# Patient Record
Sex: Male | Born: 2000 | Race: Black or African American | Hispanic: No | Marital: Single | Smoking: Never smoker
Health system: Southern US, Community
[De-identification: ages and names within clinical notes are randomized; demographics above are authoritative.]

## PROBLEM LIST (undated history)

## (undated) DIAGNOSIS — L0291 Cutaneous abscess, unspecified: Secondary | ICD-10-CM

## (undated) DIAGNOSIS — J45909 Unspecified asthma, uncomplicated: Secondary | ICD-10-CM

---
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
## 2019-12-06 ENCOUNTER — Emergency Department (HOSPITAL_BASED_OUTPATIENT_CLINIC_OR_DEPARTMENT_OTHER): Payer: BC Managed Care – PPO

## 2019-12-06 ENCOUNTER — Emergency Department (HOSPITAL_BASED_OUTPATIENT_CLINIC_OR_DEPARTMENT_OTHER)
Admission: EM | Admit: 2019-12-06 | Discharge: 2019-12-06 | Disposition: A | Discharge status: Home / Self Care | Payer: BC Managed Care – PPO | Attending: Emergency Medicine | Admitting: Emergency Medicine

## 2019-12-06 ENCOUNTER — Encounter (HOSPITAL_BASED_OUTPATIENT_CLINIC_OR_DEPARTMENT_OTHER): Payer: Self-pay | Admitting: Emergency Medicine

## 2019-12-06 ENCOUNTER — Other Ambulatory Visit: Payer: Self-pay

## 2019-12-06 DIAGNOSIS — Y92310 Basketball court as the place of occurrence of the external cause: Secondary | ICD-10-CM | POA: Insufficient documentation

## 2019-12-06 DIAGNOSIS — W2209XA Striking against other stationary object, initial encounter: Secondary | ICD-10-CM | POA: Diagnosis not present

## 2019-12-06 DIAGNOSIS — S93105A Unspecified dislocation of left toe(s), initial encounter: Secondary | ICD-10-CM

## 2019-12-06 DIAGNOSIS — Y999 Unspecified external cause status: Secondary | ICD-10-CM | POA: Insufficient documentation

## 2019-12-06 DIAGNOSIS — Y9367 Activity, basketball: Secondary | ICD-10-CM | POA: Insufficient documentation

## 2019-12-06 DIAGNOSIS — S93112A Dislocation of interphalangeal joint of left great toe, initial encounter: Secondary | ICD-10-CM | POA: Insufficient documentation

## 2019-12-06 DIAGNOSIS — S99922A Unspecified injury of left foot, initial encounter: Secondary | ICD-10-CM | POA: Diagnosis present

## 2019-12-06 MED ORDER — IBUPROFEN 800 MG PO TABS: 800.0000 mg | ORAL_TABLET | Freq: Three times a day (TID) | ORAL | 0 refills | Status: DC | PRN

## 2019-12-06 MED ORDER — BUPIVACAINE HCL 0.5 % IJ SOLN
20.0000 mL | Freq: Once | INTRAMUSCULAR | Status: AC
  Administered 2019-12-06: 20 mL
  Filled 2019-12-06: qty 1

## 2019-12-06 NOTE — ED Triage Notes (Signed)
Pt reports playing basketball today, when left big toe hit into goal post. Pt seen at urgent care, sent here for further eval.

## 2019-12-06 NOTE — Discharge Instructions (Signed)
Return here as needed.  Ice and elevate your toe.  Follow-up as needed with the orthopedist provided.

## 2019-12-06 NOTE — ED Provider Notes (Signed)
[facility] Provider Note   CSN: [id number] Arrival date & time: 12/06/19  1556     History Chief Complaint  Patient presents with  . Toe Injury    Max Clark is a 19 y.o. male.  HPI Patient presents to the emergency department with toe injury that was seen at an urgent care.  The patient states that he hit his toe against a pole while playing basketball today.  The patient states that he was sent here for further treatment of the dislocation of his great toe.  Patient has no other injuries.    Past Medical History:  Diagnosis Date  . Asthma     There are no problems to display for this patient.   History reviewed. No pertinent surgical history.     No family history on file.  Social History   Tobacco Use  . Smoking status: Never Smoker  . Smokeless tobacco: Never Used  Substance Use Topics  . Alcohol use: Not Currently  . Drug use: Not Currently    Home Medications Prior to Admission medications   Not on File    Allergies    Patient has no known allergies.  Review of Systems   Review of Systems All other systems negative except as documented in the HPI. All pertinent positives and negatives as reviewed in the HPI. Physical Exam Updated Vital Signs BP 133/81   Pulse 88   Temp 99.1 F (37.3 C) (Oral)   Resp 18   Ht 6\' 2"  (1.88 m)   Wt 77.1 kg   SpO2 100%   BMI 21.83 kg/m   Physical Exam Vitals and nursing note reviewed.  Constitutional:      General: He is not in acute distress.    Appearance: He is well-developed.  HENT:     Head: Normocephalic and atraumatic.  Eyes:     Pupils: Pupils are equal, round, and reactive to light.  Pulmonary:     Effort: Pulmonary effort is normal.  Musculoskeletal:     Left foot: Decreased range of motion. Normal capillary refill. Deformity, tenderness and bony tenderness present.       Feet:  Skin:    General: Skin is warm and dry.  Neurological:     Mental Status: He  is alert and oriented to person, place, and time.     ED Results / Procedures / Treatments   Labs (all labs ordered are listed, but only abnormal results are displayed) Labs Reviewed - No data to display  EKG None  Radiology DG Toe Great Left  Result Date: 12/06/2019 CLINICAL DATA:  Left great toe pain after basketball injury, pt slammed his toe into metal post, dislocated, reduction attempted at UC without success, sent to ED for eval EXAM: LEFT GREAT TOE COMPARISON:  None. FINDINGS: There is antrolateral dislocation of the distal phalanx of the great toe. No definite fracture identified. No focal bone lesion or significant degenerative changes. Regional soft tissues are unremarkable. IMPRESSION: Anterolateral dislocation of the distal phalanx of the great toe. Electronically Signed   By: Audie Pinto M.D.   On: 12/06/2019 16:48    Procedures Reduction of dislocation  Date/Time: 12/06/2019 6:01 PM Performed by: Dalia Heading, PA-C Authorized by: Dalia Heading, PA-C  Consent: Verbal consent obtained. Written consent not obtained. Risks and benefits: risks, benefits and alternatives were discussed Consent given by: patient Patient understanding: patient states understanding of the procedure being performed Patient consent: the patient's understanding of the procedure  matches consent given Imaging studies: imaging studies available Patient identity confirmed: verbally with patient Local anesthesia used: yes Anesthesia: digital block  Anesthesia: Local anesthesia used: yes Local Anesthetic: bupivacaine 0.5% without epinephrine Anesthetic total: 6 mL  Sedation: Patient sedated: no  Patient tolerance: patient tolerated the procedure well with no immediate complications Comments: Successful reduction of the dislocation    (including critical care time)  Medications Ordered in ED Medications  bupivacaine (MARCAINE) 0.5 % (with pres) injection 20 mL (20 mLs  Infiltration Given by Other 12/06/19 1709)    ED Course  I have reviewed the triage vital signs and the nursing notes.  Pertinent labs & imaging results that were available during my care of the patient were reviewed by me and considered in my medical decision making (see chart for details).    MDM Rules/Calculators/A&P                       Final Clinical Impression(s) / ED Diagnoses Final diagnoses:  None    Rx / DC Orders ED Discharge Orders    None       Charlestine Night, PA-C 12/06/19 1802    Pollyann Savoy, MD 12/06/19 [phone]

## 2020-06-09 ENCOUNTER — Encounter (HOSPITAL_BASED_OUTPATIENT_CLINIC_OR_DEPARTMENT_OTHER): Payer: Self-pay | Admitting: Emergency Medicine

## 2020-06-09 ENCOUNTER — Emergency Department (HOSPITAL_BASED_OUTPATIENT_CLINIC_OR_DEPARTMENT_OTHER)
Admission: EM | Admit: 2020-06-09 | Discharge: 2020-06-09 | Disposition: A | Discharge status: Home / Self Care | Payer: BC Managed Care – PPO | Attending: Emergency Medicine | Admitting: Emergency Medicine

## 2020-06-09 ENCOUNTER — Other Ambulatory Visit: Payer: Self-pay

## 2020-06-09 DIAGNOSIS — J45909 Unspecified asthma, uncomplicated: Secondary | ICD-10-CM | POA: Diagnosis not present

## 2020-06-09 DIAGNOSIS — N5089 Other specified disorders of the male genital organs: Secondary | ICD-10-CM | POA: Diagnosis present

## 2020-06-09 DIAGNOSIS — N454 Abscess of epididymis or testis: Secondary | ICD-10-CM | POA: Diagnosis not present

## 2020-06-09 DIAGNOSIS — N492 Inflammatory disorders of scrotum: Secondary | ICD-10-CM

## 2020-06-09 HISTORY — DX: Cutaneous abscess, unspecified: L02.91

## 2020-06-09 MED ORDER — DOXYCYCLINE HYCLATE 100 MG PO CAPS: 100.0000 mg | ORAL_CAPSULE | Freq: Two times a day (BID) | ORAL | 0 refills | Status: DC

## 2020-06-09 MED ORDER — LIDOCAINE-EPINEPHRINE 2 %-1:200000 IJ SOLN
10.0000 mL | Freq: Once | INTRAMUSCULAR | Status: AC
Start: 2020-06-09 — End: 2020-06-09
  Administered 2020-06-09: 10 mL via INTRADERMAL
  Filled 2020-06-09: qty 20

## 2020-06-09 MED FILL — DOXYCYCLINE HYCLATE 100 MG: 100 | 7 days supply | Qty: 14 | Fill #0

## 2020-06-09 NOTE — ED Triage Notes (Signed)
Pt has abscess to left testicle since Tuesday.  Pt states he gets them on his legs occasionally and they usually rupture but this one has not.  No fevers at home.

## 2020-06-09 NOTE — Discharge Instructions (Signed)
Use warm compresses at least 4 times a day for 10 to 15 minutes until the area has healed.  Please return for rapid spreading redness if you develop a fever or if you feel systemically unwell.

## 2020-06-09 NOTE — ED Provider Notes (Signed)
[facility] EMERGENCY DEPARTMENT Provider Note   CSN: [id number] Arrival date & time: 06/09/20  0730     History Chief Complaint  Patient presents with  . Abscess    Max Clark is a 19 y.o. male.  19 yo M with a chief complaints of left scrotal swelling.  The patient has had recurrent infections to his skin in his groin.  Off and on for some time.  Usually develops in 2 ahead and is able to express it.  This 1 is gotten progressively worse and due to the location he decided to come to the ED for evaluation.  Denies fevers.  He does shave the area infrequently.  The history is provided by the patient.  Abscess Location:  Pelvis Pelvic abscess location:  Scrotum Size:  Quarter Abscess quality: fluctuance, painful and redness   Red streaking: no   Duration:  2 days Progression:  Worsening Pain details:    Quality:  Aching   Severity:  Moderate   Duration:  2 days   Timing:  Constant   Progression:  Worsening Chronicity:  New Relieved by:  Nothing Worsened by:  Nothing Ineffective treatments:  None tried Associated symptoms: no fever, no headaches and no vomiting        Past Medical History:  Diagnosis Date  . Abscess   . Asthma     There are no problems to display for this patient.   History reviewed. No pertinent surgical history.     No family history on file.  Social History   Tobacco Use  . Smoking status: Never Smoker  . Smokeless tobacco: Never Used  Vaping Use  . Vaping Use: Never used  Substance Use Topics  . Alcohol use: Not Currently  . Drug use: Not Currently    Home Medications Prior to Admission medications   Medication Sig Start Date End Date Taking? Authorizing Provider  doxycycline (VIBRAMYCIN) 100 MG capsule Take 1 capsule (100 mg total) by mouth 2 (two) times daily. One po bid x 7 days 06/09/20   Melene Plan, DO    Allergies    Patient has no known allergies.  Review of Systems   Review of Systems    Constitutional: Negative for chills and fever.  HENT: Negative for congestion and facial swelling.   Eyes: Negative for discharge and visual disturbance.  Respiratory: Negative for shortness of breath.   Cardiovascular: Negative for chest pain and palpitations.  Gastrointestinal: Negative for abdominal pain, diarrhea and vomiting.  Musculoskeletal: Negative for arthralgias and myalgias.  Skin: Positive for wound. Negative for color change and rash.  Neurological: Negative for tremors, syncope and headaches.  Psychiatric/Behavioral: Negative for confusion and dysphoric mood.    Physical Exam Updated Vital Signs BP 140/85   Pulse 83   Temp 99.5 F (37.5 C) (Oral)   Resp 16   Ht 6\' 2"  (1.88 m)   Wt 78.9 kg   SpO2 99%   BMI 22.34 kg/m   Physical Exam Vitals and nursing note reviewed.  Constitutional:      Appearance: He is well-developed.  HENT:     Head: Normocephalic and atraumatic.  Eyes:     Pupils: Pupils are equal, round, and reactive to light.  Neck:     Vascular: No JVD.  Cardiovascular:     Rate and Rhythm: Normal rate and regular rhythm.     Heart sounds: No murmur heard.  No friction rub. No gallop.   Pulmonary:  Effort: No respiratory distress.     Breath sounds: No wheezing.  Abdominal:     General: There is no distension.     Tenderness: There is no guarding or rebound.  Genitourinary:    Comments: Swelling to the left scrotal wall, fluctuance to the inferior aspects.  Multiple nodular areas of old appearing abscesses Musculoskeletal:        General: Normal range of motion.     Cervical back: Normal range of motion and neck supple.  Skin:    Coloration: Skin is not pale.     Findings: No rash.  Neurological:     Mental Status: He is alert and oriented to person, place, and time.  Psychiatric:        Behavior: Behavior normal.     ED Results / Procedures / Treatments   Labs (all labs ordered are listed, but only abnormal results are  displayed) Labs Reviewed - No data to display  EKG None  Radiology No results found.  Procedures .Marland KitchenIncision and Drainage  Date/Time: 06/09/2020 8:49 AM Performed by: Melene Plan, DO Authorized by: Melene Plan, DO   Consent:    Consent obtained:  Verbal   Consent given by:  Patient   Risks discussed:  Bleeding, incomplete drainage and infection   Alternatives discussed:  No treatment, delayed treatment and alternative treatment Location:    Type:  Abscess   Size:  Quarter   Location:  Anogenital   Anogenital location:  Scrotal wall Pre-procedure details:    Skin preparation:  Chloraprep Anesthesia (see MAR for exact dosages):    Anesthesia method:  Local infiltration   Local anesthetic:  Lidocaine 2% WITH epi Procedure type:    Complexity:  Complex Procedure details:    Needle aspiration: no     Incision types:  Single straight   Incision depth:  Subcutaneous   Scalpel blade:  11   Wound management:  Probed and deloculated   Drainage:  Bloody and purulent   Drainage amount:  Copious   Wound treatment:  Wound left open   Packing materials:  None Post-procedure details:    Patient tolerance of procedure:  Tolerated well, no immediate complications   (including critical care time)  Medications Ordered in ED Medications  lidocaine-EPINEPHrine (XYLOCAINE W/EPI) 2 %-1:200000 (PF) injection 10 mL (10 mLs Intradermal Given by Other 06/09/20 8242)    ED Course  I have reviewed the triage vital signs and the nursing notes.  Pertinent labs & imaging results that were available during my care of the patient were reviewed by me and considered in my medical decision making (see chart for details).    MDM Rules/Calculators/A&P                          19 yo M with a chief complaints of left-sided scrotal swelling.  This has been an off-and-on issue for him.  Clinically the patient has an abscess.  Will drain at bedside.  PCP follow-up.  8:50 AM:  I have discussed the  diagnosis/risks/treatment options with the patient and believe the pt to be eligible for discharge home to follow-up with PCP. We also discussed returning to the ED immediately if new or worsening sx occur. We discussed the sx which are most concerning (e.g., sudden worsening pain, fever, inability to tolerate by mouth) that necessitate immediate return. Medications administered to the patient during their visit and any new prescriptions provided to the patient are listed below.  Medications given during this visit Medications  lidocaine-EPINEPHrine (XYLOCAINE W/EPI) 2 %-1:200000 (PF) injection 10 mL (10 mLs Intradermal Given by Other 06/09/20 2119)     The patient appears reasonably screen and/or stabilized for discharge and I doubt any other medical condition or other [facility] requiring further screening, evaluation, or treatment in the ED at this time prior to discharge.    Final Clinical Impression(s) / ED Diagnoses Final diagnoses:  Scrotal wall abscess    Rx / DC Orders ED Discharge Orders         Ordered    doxycycline (VIBRAMYCIN) 100 MG capsule  2 times daily        06/09/20 0848           Melene Plan, DO 06/09/20 [id number]

## 2020-10-14 ENCOUNTER — Other Ambulatory Visit: Payer: BC Managed Care – PPO

## 2021-07-30 ENCOUNTER — Other Ambulatory Visit: Payer: Self-pay

## 2021-07-30 ENCOUNTER — Emergency Department (HOSPITAL_BASED_OUTPATIENT_CLINIC_OR_DEPARTMENT_OTHER)
Admission: EM | Admit: 2021-07-30 | Discharge: 2021-07-30 | Disposition: A | Discharge status: Home / Self Care | Payer: BC Managed Care – PPO | Attending: Emergency Medicine | Admitting: Emergency Medicine

## 2021-07-30 ENCOUNTER — Emergency Department (HOSPITAL_BASED_OUTPATIENT_CLINIC_OR_DEPARTMENT_OTHER): Payer: BC Managed Care – PPO

## 2021-07-30 ENCOUNTER — Encounter (HOSPITAL_BASED_OUTPATIENT_CLINIC_OR_DEPARTMENT_OTHER): Payer: Self-pay | Admitting: Emergency Medicine

## 2021-07-30 DIAGNOSIS — R2231 Localized swelling, mass and lump, right upper limb: Secondary | ICD-10-CM | POA: Insufficient documentation

## 2021-07-30 DIAGNOSIS — M25421 Effusion, right elbow: Secondary | ICD-10-CM

## 2021-07-30 DIAGNOSIS — M25521 Pain in right elbow: Secondary | ICD-10-CM | POA: Insufficient documentation

## 2021-07-30 DIAGNOSIS — J45909 Unspecified asthma, uncomplicated: Secondary | ICD-10-CM | POA: Insufficient documentation

## 2021-07-30 MED ORDER — DOXYCYCLINE HYCLATE 100 MG PO CAPS: 100.0000 mg | ORAL_CAPSULE | Freq: Two times a day (BID) | ORAL | 0 refills | Status: AC

## 2021-07-30 NOTE — ED Notes (Signed)
ED Provider and PA at bedside.

## 2021-07-30 NOTE — ED Notes (Signed)
Pt transported to xray 

## 2021-07-30 NOTE — ED Triage Notes (Signed)
Pt c/o abscess to right elbow onset Tuesday morning. Pt picked the area causing swelling to right elbow. Pt also reports injury from tae kwon do by blocking kick with that elbow. Pt denies fever. Family reports father has history of melanoma and is concerned because patient has area on right abdomen thought to be a cyst.

## 2021-07-30 NOTE — ED Provider Notes (Signed)
[facility] EMERGENCY DEPARTMENT Provider Note   CSN: [id number] Arrival date & time: 07/30/21  0944     History Chief Complaint  Patient presents with   Joint Swelling    Max Clark is a 20 y.o. male with a history of asthma and previous abscesses.  Presents emergency department with chief complaint of right elbow pain and swelling.  Patient states that on Monday while in [place] he blocked a kick with his elbow.  Patient awoke the next morning to have pain and swelling to right elbow.  Patient reports that swelling increased throughout the week.  On Thursday a small wound opened below his elbow which produced purulent discharge.  Patient states that he had continued swelling after this event.  Patient endorses pain with range of motion.  Rates pain 8/10 on the pain scale.  Pain is improved with Tylenol.  Patient denies any fevers, chills, nausea, vomiting, rash, numbness, weakness.  Patient is right-hand dominant.  HPI     Past Medical History:  Diagnosis Date   Abscess    Asthma     There are no problems to display for this patient.   History reviewed. No pertinent surgical history.     No family history on file.  Social History   Tobacco Use   Smoking status: Never   Smokeless tobacco: Never  Vaping Use   Vaping Use: Never used  Substance Use Topics   Alcohol use: Not Currently   Drug use: Not Currently    Home Medications Prior to Admission medications   Medication Sig Start Date End Date Taking? Authorizing Provider  doxycycline (VIBRAMYCIN) 100 MG capsule Take 1 capsule (100 mg total) by mouth 2 (two) times daily. One po bid x 7 days 06/09/20   Melene Plan, DO    Allergies    Patient has no known allergies.  Review of Systems   Review of Systems  Constitutional:  Negative for chills, fatigue and fever.  Respiratory:  Negative for shortness of breath.   Cardiovascular:  Negative for chest pain.  Gastrointestinal:  Negative for  abdominal pain, nausea and vomiting.  Musculoskeletal:  Positive for arthralgias and joint swelling. Negative for back pain and neck pain.  Skin:  Positive for wound. Negative for color change and rash.  Allergic/Immunologic: Negative for immunocompromised state.  Neurological:  Negative for dizziness, syncope, light-headedness and headaches.  Hematological:  Does not bruise/bleed easily.  Psychiatric/Behavioral:  Negative for confusion.    Physical Exam Updated Vital Signs BP 130/68 (BP Location: Left Arm)   Pulse (!) 58   Temp 98.6 F (37 C) (Oral)   Resp 16   Ht 6\' 2"  (1.88 m)   Wt 81.6 kg   SpO2 100%   BMI 23.11 kg/m   Physical Exam Vitals and nursing note reviewed.  Constitutional:      General: He is not in acute distress.    Appearance: He is not ill-appearing, toxic-appearing or diaphoretic.  HENT:     Head: Normocephalic.  Eyes:     General: No scleral icterus.       Right eye: No discharge.        Left eye: No discharge.  Cardiovascular:     Rate and Rhythm: Normal rate.     Pulses:          Radial pulses are 2+ on the right side.  Pulmonary:     Effort: Pulmonary effort is normal.  Musculoskeletal:     Right shoulder: Normal.  Right upper arm: Normal.     Right elbow: Swelling present. No deformity, effusion or lacerations. Normal range of motion. Tenderness (diffuse) present.     Right forearm: Normal.     Right wrist: Normal.     Right hand: No swelling, deformity, lacerations, tenderness or bony tenderness. Normal range of motion. Normal strength. Normal sensation. Normal capillary refill. Normal pulse.     Comments: Patient has swelling to right elbow.  No warmth or erythema noted.  Small wound distal to right elbow.  No fluctuance, induration, and minimal erythema surrounding wound.   Skin:    General: Skin is warm and dry.  Neurological:     General: No focal deficit present.     Mental Status: He is alert.  Psychiatric:        Behavior:  Behavior is cooperative.    ED Results / Procedures / Treatments   Labs (all labs ordered are listed, but only abnormal results are displayed) Labs Reviewed - No data to display  EKG None  Radiology DG Elbow Complete Right  Result Date: 07/30/2021 CLINICAL DATA:  Elbow injury. EXAM: RIGHT ELBOW - COMPLETE 3+ VIEW COMPARISON:  None. FINDINGS: Soft tissue swelling medial to the forearm. No elbow joint effusion, fracture, or subluxation. IMPRESSION: Medial soft tissue swelling.  No fracture or joint effusion. Electronically Signed   By: Tiburcio Pea M.D.   On: 07/30/2021 11:39    Procedures Procedures   Medications Ordered in ED Medications - No data to display  ED Course  I have reviewed the triage vital signs and the nursing notes.  Pertinent labs & imaging results that were available during my care of the patient were reviewed by me and considered in my medical decision making (see chart for details).    MDM Rules/Calculators/A&P                           Alert 20 year old male no acute distress, nontoxic-appearing.  Presents to emergency department with chief complaint of swelling and pain to right elbow after trauma.  Patient denies any fevers or chills.  Patient has no erythema or warmth to elbow.  Full range of motion.  Low suspicion for septic arthritis or bursitis at this time.  Small wound distal to right elbow.  No fluctuance, induration, and minimal erythema surrounding wound.  Possible previous abscess in the ear however does not appear to have any drainable fluid collection at this time.  For patient's trauma will obtain x-ray imaging to evaluate for possible fracture.  If x-ray is unremarkable plan to discharge patient with advice for compression NSAID use.  X-ray imaging shows no acute osseous abnormality.  Discussed results, findings, treatment and follow up. Patient advised of return precautions. Patient verbalized understanding and agreed with  plan.   Patient was discussed with and evaluated by Dr. Jacqulyn Bath.    Final Clinical Impression(s) / ED Diagnoses Final diagnoses:  None    Rx / DC Orders ED Discharge Orders     None        Berneice Heinrich 07/30/21 1208    Maia Plan, MD 08/06/21 1410

## 2021-07-30 NOTE — Discharge Instructions (Addendum)
You came to the emergency department today to be evaluated for your right elbow pain and swelling.  Your physical exam was reassuring.  You are likely dealing with bursitis which is inflammation of the trackers over a pad in your elbow.  Please take anti-inflammatory drugs as noted below as well as apply compression to your elbow.  Swelling should increase over the next few weeks.    We have also given you a prescription for antibiotics due to possible skin infection below your elbow.  Please take the antibiotic, Doxycycline, every 12 hours until gone. A side effect of this medication includes hypersensitivity to the suns rays - please take measures to protect your skin from the sun while taking this medication.  You may have diarrhea from the antibiotics.  It is very important that you continue to take the antibiotics even if you get diarrhea unless a medical professional tells you that you may stop taking them.  If you stop too early the bacteria you are being treated for will become stronger and you may need different, more powerful antibiotics that have more side effects and worsening diarrhea.  Please stay well hydrated and consider probiotics as they may decrease the severity of your diarrhea.   Please take Ibuprofen (Advil, motrin) and Tylenol (acetaminophen) to relieve your pain.    You may take up to 600 MG (3 pills) of normal strength ibuprofen every 8 hours as needed.   You make take tylenol, up to 1,000 mg (two extra strength pills) every 8 hours as needed.   It is safe to take ibuprofen and tylenol at the same time as they work differently.   Do not take more than 3,000 mg tylenol in a 24 hour period (not more than one dose every 8 hours.  Please check all medication labels as many medications such as pain and cold medications may contain tylenol.  Do not drink alcohol while taking these medications.  Do not take other NSAID'S while taking ibuprofen (such as aleve or naproxen).  Please take  ibuprofen with food to decrease stomach upset.   Get help right away if you: Have signs of infection in your joint. Watch for: Very severe pain. Redness. Warmth. Swelling. Have rapid breathing or you have trouble breathing. Have chest pain. Cannot drink fluids or make urine. Notice that the affected area changed color or turned blue. Have numbness or severe pain in the affected area.

## 2022-06-06 IMAGING — DX DG ELBOW COMPLETE 3+V*R*
4 series · 4 of 4 positions shown · non-contrast
Comparison: None.

CLINICAL DATA: Elbow injury.

EXAM:
RIGHT ELBOW - COMPLETE 3+ VIEW

[elbow ap]
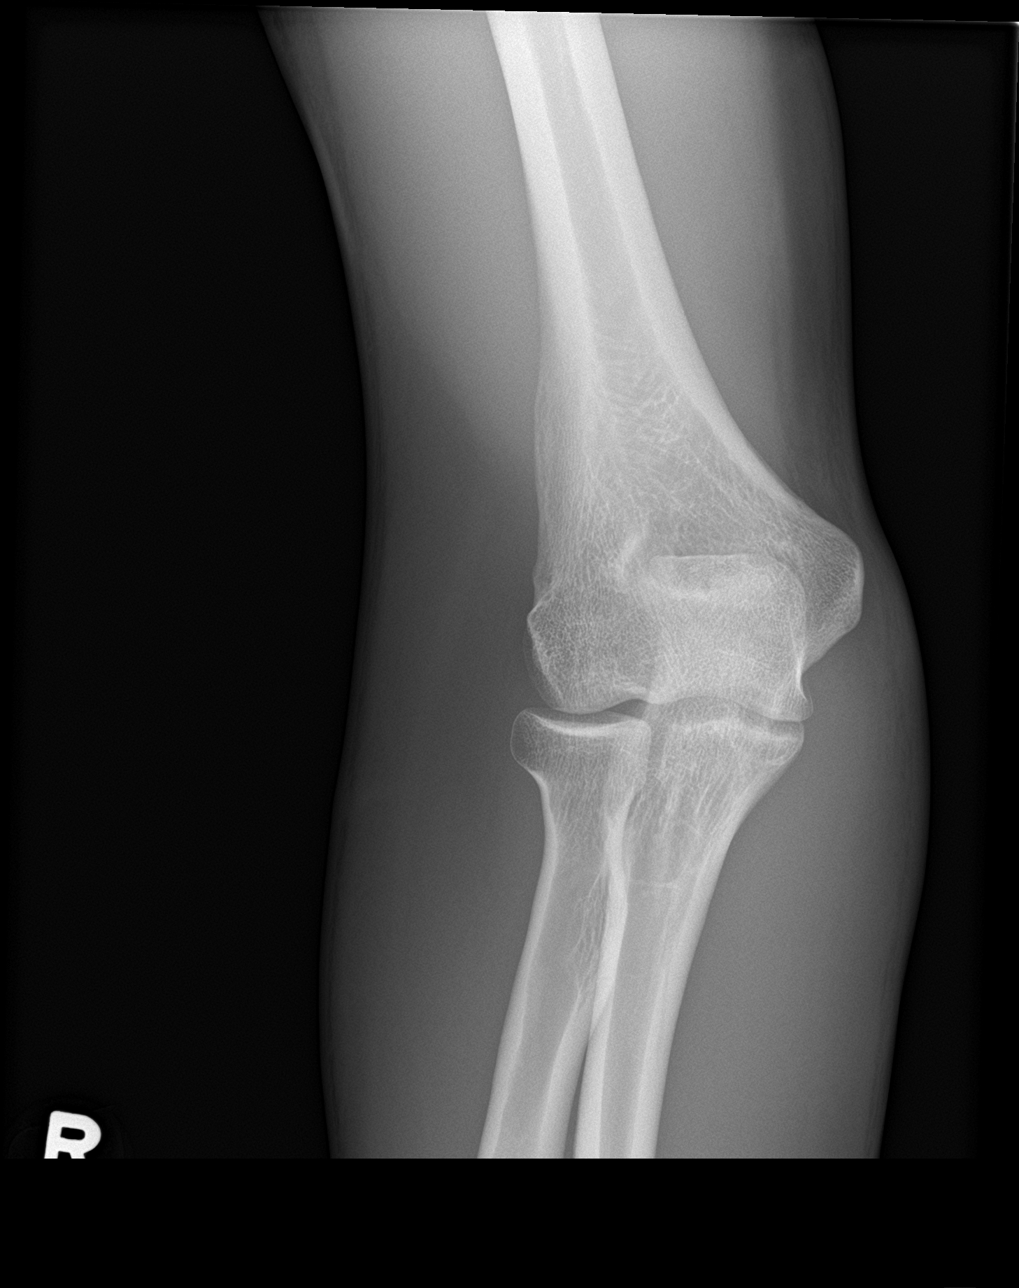

[elbow obl (1 of 2)]
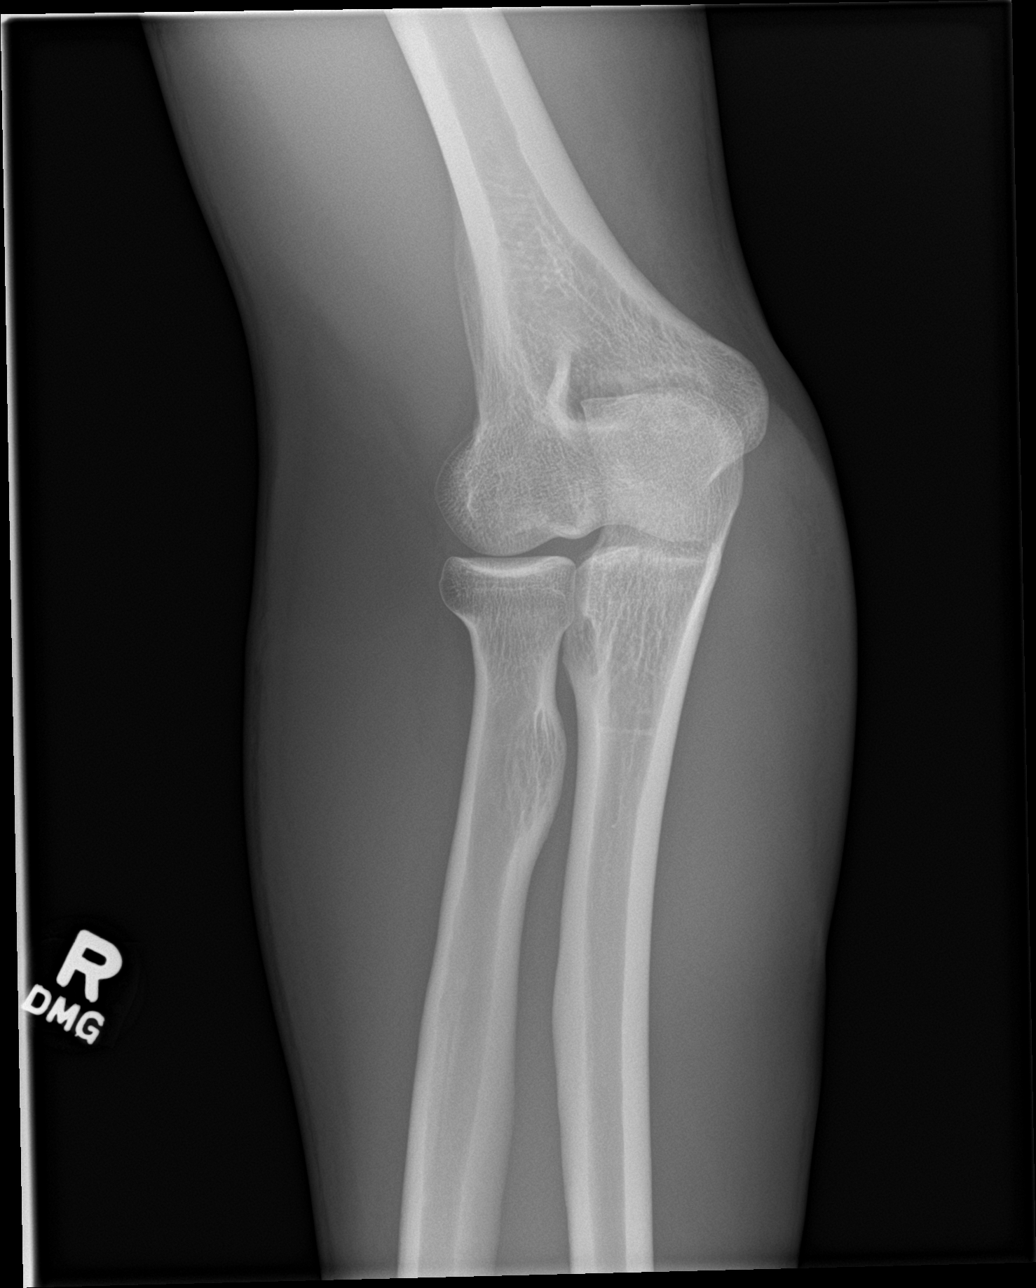

[elbow obl (2 of 2)]
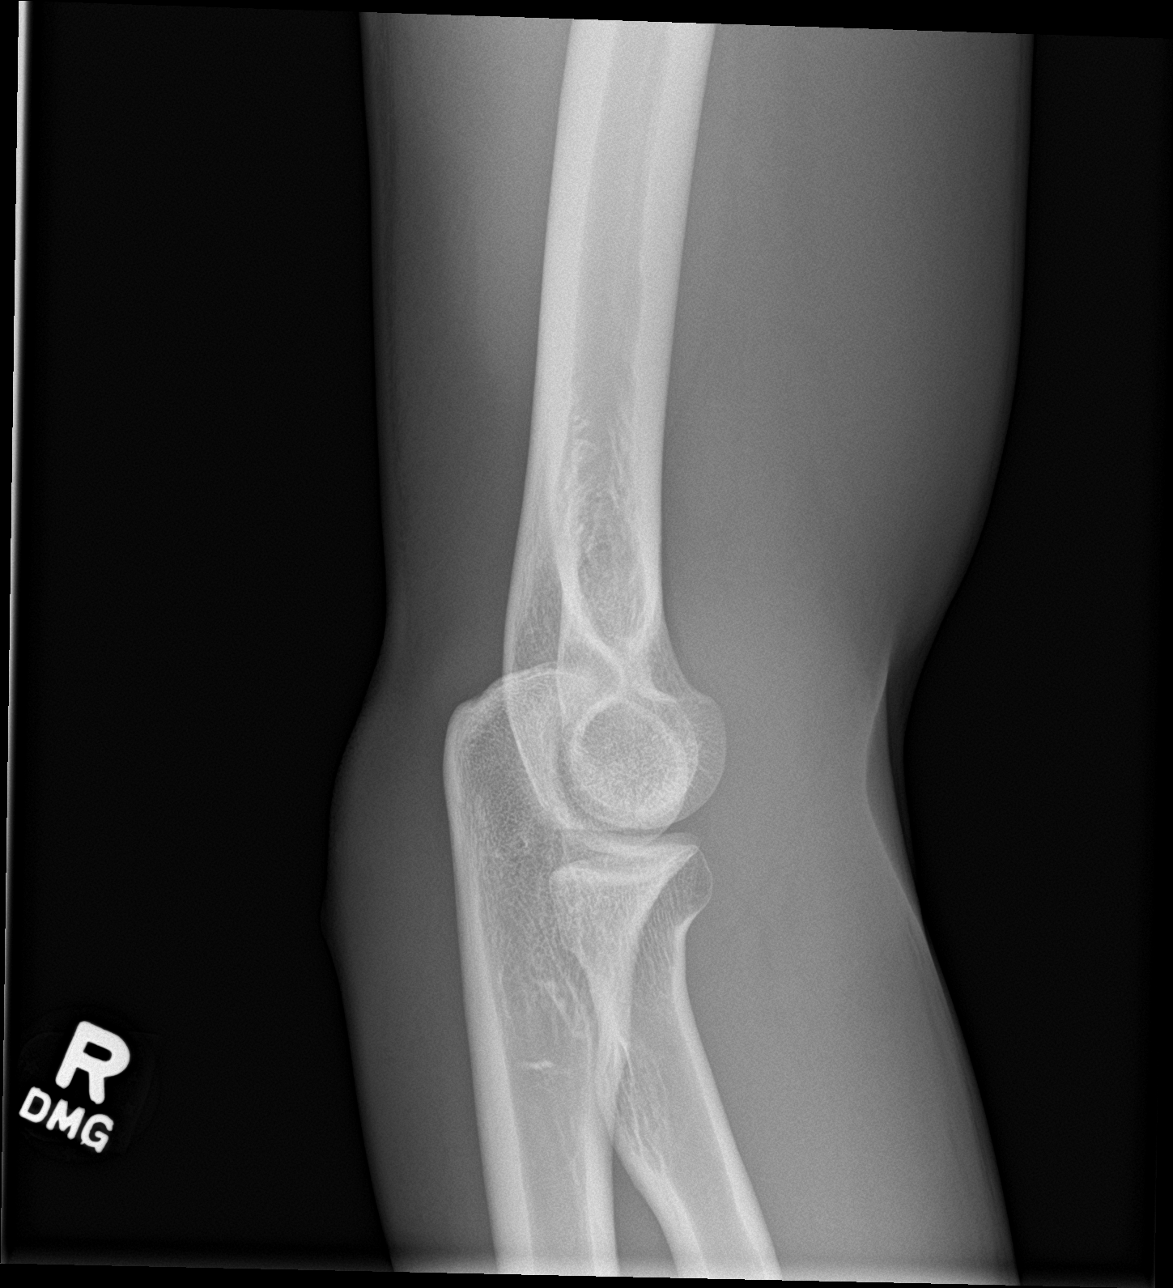

[elbow lat]
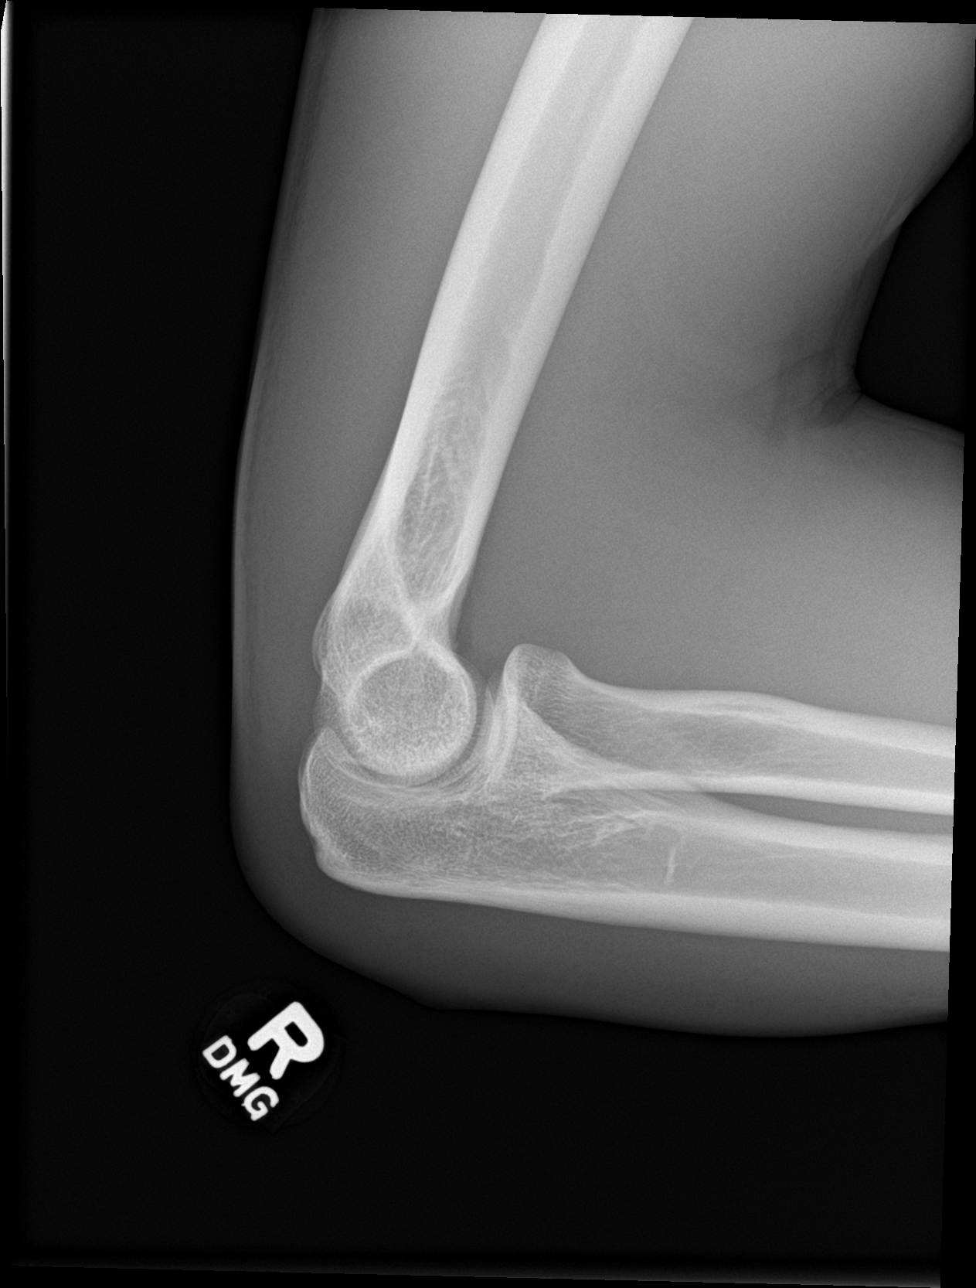

[4 of 4 positions shown; findings below may reference images not displayed]

FINDINGS: Soft tissue swelling medial to the forearm. No elbow joint effusion,
fracture, or subluxation.
IMPRESSION: Medial soft tissue swelling.  No fracture or joint effusion.

## 2022-11-14 ENCOUNTER — Encounter (HOSPITAL_BASED_OUTPATIENT_CLINIC_OR_DEPARTMENT_OTHER): Payer: Self-pay | Admitting: Emergency Medicine

## 2022-11-14 ENCOUNTER — Other Ambulatory Visit: Payer: Self-pay

## 2022-11-14 ENCOUNTER — Emergency Department (HOSPITAL_BASED_OUTPATIENT_CLINIC_OR_DEPARTMENT_OTHER)
Admission: EM | Admit: 2022-11-14 | Discharge: 2022-11-14 | Disposition: A | Discharge status: Home / Self Care | Payer: 59 | Attending: Emergency Medicine | Admitting: Emergency Medicine

## 2022-11-14 DIAGNOSIS — H6121 Impacted cerumen, right ear: Secondary | ICD-10-CM

## 2022-11-14 NOTE — ED Triage Notes (Signed)
Pt states he has ear fullness due to earwax and would like it de clogged.  It has been happening for 1-2 years ongoing.

## 2022-11-14 NOTE — ED Provider Notes (Signed)
[place] [facility] Provider Note   CSN: [id number] Arrival date & time: 11/14/22  1450     History  Chief Complaint  Patient presents with   Ear Fullness    Max Clark is a 22 y.o. male.  Pt is a 22 yo male with no pmhx.  Pt said he feels like his ears are clogged with wax and wants the wax removed.  No other sx.       Home Medications Prior to Admission medications   Not on File      Allergies    Patient has no known allergies.    Review of Systems   Review of Systems  HENT:  Positive for hearing loss.   All other systems reviewed and are negative.   Physical Exam Updated Vital Signs BP 127/68 (BP Location: Left Arm)   Pulse 70   Temp 98.7 F (37.1 C) (Oral)   Resp 18   Ht 6' 3"$  (1.905 m)   Wt 81.6 kg   SpO2 100%   BMI 22.50 kg/m  Physical Exam Vitals and nursing note reviewed.  Constitutional:      Appearance: Normal appearance.  HENT:     Head: Normocephalic and atraumatic.     Right Ear: External ear normal. There is impacted cerumen.     Left Ear: Tympanic membrane, ear canal and external ear normal.     Nose: Nose normal.     Mouth/Throat:     Mouth: Mucous membranes are moist.     Pharynx: Oropharynx is clear.  Eyes:     Extraocular Movements: Extraocular movements intact.     Conjunctiva/sclera: Conjunctivae normal.     Pupils: Pupils are equal, round, and reactive to light.  Cardiovascular:     Rate and Rhythm: Normal rate and regular rhythm.     Pulses: Normal pulses.     Heart sounds: Normal heart sounds.  Pulmonary:     Effort: Pulmonary effort is normal.     Breath sounds: Normal breath sounds.  Abdominal:     General: Abdomen is flat. Bowel sounds are normal.     Palpations: Abdomen is soft.  Musculoskeletal:        General: Normal range of motion.     Cervical back: Normal range of motion and neck supple.  Skin:    General: Skin is warm.     Capillary Refill: Capillary refill  takes less than 2 seconds.  Neurological:     General: No focal deficit present.     Mental Status: He is alert and oriented to person, place, and time.  Psychiatric:        Mood and Affect: Mood normal.        Behavior: Behavior normal.     ED Results / Procedures / Treatments   Labs (all labs ordered are listed, but only abnormal results are displayed) Labs Reviewed - No data to display  EKG None  Radiology No results found.  Procedures Procedures    Medications Ordered in ED Medications - No data to display  ED Course/ Medical Decision Making/ A&P                             Medical Decision Making  Pt's ear wax removed by the paramedic.  Pt feels much better.  He is stable for d/c.  Return if worse.         Final  Clinical Impression(s) / ED Diagnoses Final diagnoses:  Impacted cerumen of right ear    Rx / DC Orders ED Discharge Orders     None         Isla Pence, MD 11/14/22 1743

## 2022-11-14 NOTE — ED Notes (Signed)
Discharge paperwork reviewed entirely with patient, including Rx's and follow up care. Pain was under control. Pt verbalized understanding as well as all parties involved. No questions or concerns voiced at the time of discharge. No acute distress noted.   Pt ambulated out to PVA without incident or assistance.
